# Patient Record
Sex: Male | Born: 2000 | Race: Black or African American | Hispanic: No | Marital: Single | Smoking: Current some day smoker
Health system: Southern US, Community
[De-identification: ages and names within clinical notes are randomized; demographics above are authoritative.]

## PROBLEM LIST (undated history)

## (undated) DIAGNOSIS — B019 Varicella without complication: Secondary | ICD-10-CM

## (undated) DIAGNOSIS — Z8669 Personal history of other diseases of the nervous system and sense organs: Secondary | ICD-10-CM

## (undated) HISTORY — DX: Varicella without complication: B01.9

---
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
## 2021-07-18 ENCOUNTER — Encounter: Payer: Self-pay | Admitting: Emergency Medicine

## 2021-07-18 ENCOUNTER — Emergency Department
Admission: EM | Admit: 2021-07-18 | Discharge: 2021-07-18 | Disposition: A | Discharge status: Home / Self Care | Payer: Medicaid Other | Attending: Emergency Medicine | Admitting: Emergency Medicine

## 2021-07-18 ENCOUNTER — Other Ambulatory Visit: Payer: Self-pay

## 2021-07-18 DIAGNOSIS — H1031 Unspecified acute conjunctivitis, right eye: Secondary | ICD-10-CM | POA: Diagnosis not present

## 2021-07-18 DIAGNOSIS — J019 Acute sinusitis, unspecified: Secondary | ICD-10-CM | POA: Insufficient documentation

## 2021-07-18 DIAGNOSIS — R519 Headache, unspecified: Secondary | ICD-10-CM | POA: Diagnosis present

## 2021-07-18 MED ORDER — AMOXICILLIN-POT CLAVULANATE 875-125 MG PO TABS: 1.0000 | ORAL_TABLET | Freq: Two times a day (BID) | ORAL | 0 refills | Status: AC

## 2021-07-18 MED ORDER — GENTAMICIN SULFATE 0.3 % OP SOLN: 1.0000 [drp] | Freq: Three times a day (TID) | OPHTHALMIC | 0 refills | Status: AC

## 2021-07-18 NOTE — ED Triage Notes (Signed)
Pt reports woke up this am and his right eye was crusted shut. Pt states all day his right eye has been burning, itching and and red.

## 2021-07-18 NOTE — Discharge Instructions (Signed)
Use the eye drops as directed. Take the antibiotic as directed. Follow-up with your provider or a local urgent care for ongoing symptoms.

## 2021-07-18 NOTE — ED Provider Notes (Signed)
[facility] Emergency Department Provider Note     Event Date/Time   First MD Initiated Contact with Patient 07/18/21 1626     (approximate)   History   Conjunctivitis   HPI  Thomas Odom is a 21 y.o. male presents to the ED with right eye crusting and matting upon awakening this morning.  He also reports itching and burning to the eye as well as some redness.  He denies any recent injury, trauma, or fevers.  He also endorses some pain to the right side of his frontal and maxillary sinuses.    Physical Exam   Triage Vital Signs: ED Triage Vitals  Enc Vitals Group     BP 07/18/21 1604 132/63     Pulse Rate 07/18/21 1604 68     Resp 07/18/21 1604 16     Temp 07/18/21 1604 98.7 F (37.1 C)     Temp Source 07/18/21 1604 Oral     SpO2 07/18/21 1604 96 %     Weight 07/18/21 1603 185 lb (83.9 kg)     Height 07/18/21 1603 6\' 2"  (1.88 m)     Head Circumference --      Peak Flow --      Pain Score 07/18/21 1603 8     Pain Loc --      Pain Edu? --      Excl. in GC? --     Most recent vital signs: Vitals:   07/18/21 1604  BP: 132/63  Pulse: 68  Resp: 16  Temp: 98.7 F (37.1 C)  SpO2: 96%    General Awake, no distress.  HEENT NCAT. PERRL. EOMI. conjunctiva injected on the right.  Some remanence of purulent crust noted to the lashes.  Tender to palpation over the frontal and maxillary sinuses.  No rhinorrhea. Mucous membranes are moist.  CV:  Good peripheral perfusion.  RESP:  Normal effort.  ABD:  No distention.     ED Results / Procedures / Treatments   Labs (all labs ordered are listed, but only abnormal results are displayed) Labs Reviewed - No data to display   EKG    RADIOLOGY   No results found.   PROCEDURES:  Critical Care performed: No  Procedures   MEDICATIONS ORDERED IN ED: Medications - No data to display   IMPRESSION / MDM / ASSESSMENT AND PLAN / ED COURSE  I reviewed the triage vital signs and the  nursing notes.                              Differential diagnosis includes, but is not limited to, AOM, sinusitis, viral URI, strep pharyngitis, bacterial conjunctivitis, viral conjunctivitis  Patient's diagnosis is consistent with acute sinusitis and right a bacterial conjunctivitis. Patient will be discharged home with prescriptions for Augmentin and Garamycin solution. Patient is to follow up with his primary provider as needed or otherwise directed. Patient is given ED precautions to return to the ED for any worsening or new symptoms.    FINAL CLINICAL IMPRESSION(S) / ED DIAGNOSES   Final diagnoses:  Acute non-recurrent sinusitis, unspecified location  Acute conjunctivitis of right eye, unspecified acute conjunctivitis type     Rx / DC Orders   ED Discharge Orders          Ordered    gentamicin (GARAMYCIN) 0.3 % ophthalmic solution  3 times daily        07/18/21  1634    amoxicillin-clavulanate (AUGMENTIN) 875-125 MG tablet  2 times daily        07/18/21 1634             Note:  This document was prepared using Dragon voice recognition software and may include unintentional dictation errors.    Lissa Hoard, PA-C 07/18/21 2359    Jene Every, MD 07/19/21 1257

## 2022-02-03 ENCOUNTER — Ambulatory Visit: Payer: Medicaid Other | Admitting: Nurse Practitioner

## 2022-03-27 ENCOUNTER — Ambulatory Visit: Payer: Medicaid Other | Admitting: Nurse Practitioner

## 2022-04-17 ENCOUNTER — Ambulatory Visit: Payer: Self-pay | Admitting: Family

## 2022-07-13 ENCOUNTER — Encounter: Payer: Self-pay | Admitting: Physician Assistant

## 2022-10-04 ENCOUNTER — Ambulatory Visit: Payer: Medicaid Other | Admitting: Nurse Practitioner

## 2022-10-26 ENCOUNTER — Encounter: Payer: Self-pay | Admitting: Emergency Medicine

## 2022-10-26 ENCOUNTER — Other Ambulatory Visit: Payer: Self-pay

## 2022-10-26 ENCOUNTER — Emergency Department
Admission: EM | Admit: 2022-10-26 | Discharge: 2022-10-26 | Disposition: A | Discharge status: Home / Self Care | Payer: Medicaid Other | Attending: Emergency Medicine | Admitting: Emergency Medicine

## 2022-10-26 DIAGNOSIS — L02415 Cutaneous abscess of right lower limb: Secondary | ICD-10-CM | POA: Diagnosis present

## 2022-10-26 DIAGNOSIS — L0291 Cutaneous abscess, unspecified: Secondary | ICD-10-CM

## 2022-10-26 DIAGNOSIS — L0231 Cutaneous abscess of buttock: Secondary | ICD-10-CM | POA: Diagnosis not present

## 2022-10-26 MED ORDER — SULFAMETHOXAZOLE-TRIMETHOPRIM 800-160 MG PO TABS
1.0000 | ORAL_TABLET | Freq: Two times a day (BID) | ORAL | 0 refills | Status: AC
  Filled 2022-10-26: qty 14, 7d supply, fill #0

## 2022-10-26 MED ORDER — LIDOCAINE HCL (PF) 1 % IJ SOLN
5.0000 mL | Freq: Once | INTRAMUSCULAR | Status: AC
  Administered 2022-10-26: 5 mL via INTRADERMAL
  Filled 2022-10-26: qty 5

## 2022-10-26 NOTE — ED Provider Notes (Signed)
[facility] Provider Note    Event Date/Time   First MD Initiated Contact with Patient 10/26/22 1334     (approximate)   History   No chief complaint on file.   HPI  Thomas Odom is a 22 y.o. male who presents for evaluation of a cyst on the back of his right leg.  Patient states it started as a small bump and has increased in size and pain.  He has not noticed any draining from the area.     Physical Exam   Triage Vital Signs: ED Triage Vitals  Encounter Vitals Group     BP 10/26/22 1257 (!) 108/56     Systolic BP Percentile --      Diastolic BP Percentile --      Pulse Rate 10/26/22 1257 76     Resp 10/26/22 1257 16     Temp 10/26/22 1257 98.6 F (37 C)     Temp Source 10/26/22 1257 Oral     SpO2 10/26/22 1257 97 %     Weight 10/26/22 1258 185 lb (83.9 kg)     Height 10/26/22 1258 6\' 3"  (1.905 m)     Head Circumference --      Peak Flow --      Pain Score 10/26/22 1258 9     Pain Loc --      Pain Education --      Exclude from Growth Chart --     Most recent vital signs: Vitals:   10/26/22 1257  BP: (!) 108/56  Pulse: 76  Resp: 16  Temp: 98.6 F (37 C)  SpO2: 97%    General: Awake, no distress.  CV:  Good peripheral perfusion.  Resp:  Normal effort.  Abd:  No distention.  Other:  About 3 cm area of fluctuance in the crease between patient's right buttock on the bottom and thigh, no erythema, no warmth   ED Results / Procedures / Treatments   Labs (all labs ordered are listed, but only abnormal results are displayed) Labs Reviewed - No data to display   PROCEDURES:  Critical Care performed: No  ..Incision and Drainage  Date/Time: 10/26/2022 2:41 PM  Performed by: Cameron Ali, PA-C Authorized by: Cameron Ali, PA-C   Consent:    Consent obtained:  Verbal   Consent given by:  Patient   Risks discussed:  Bleeding, incomplete drainage, pain and infection   Alternatives discussed:  No  treatment Universal protocol:    Patient identity confirmed:  Verbally with patient Location:    Type:  Abscess   Size:  5 cm   Location:  Lower extremity   Lower extremity location:  Buttock   Buttock location:  R buttock Pre-procedure details:    Skin preparation:  Povidone-iodine Sedation:    Sedation type:  None Anesthesia:    Anesthesia method:  Local infiltration   Local anesthetic:  Lidocaine 1% w/o epi Procedure type:    Complexity:  Complex Procedure details:    Ultrasound guidance: no     Incision types:  Stab incision   Incision depth:  Dermal   Wound management:  Probed and deloculated   Drainage:  Bloody and purulent   Drainage amount:  Moderate   Wound treatment:  Wound left open   Packing materials:  1/4 in iodoform gauze   Amount 1/4" iodoform:  15 cm Post-procedure details:    Procedure completion:  Tolerated    MEDICATIONS ORDERED IN ED:  Medications  lidocaine (PF) (XYLOCAINE) 1 % injection 5 mL (5 mLs Intradermal Given by Other 10/26/22 1435)     IMPRESSION / MDM / ASSESSMENT AND PLAN / ED COURSE  I reviewed the triage vital signs and the nursing notes.                             22 year old male presents for evaluation of possible cyst on the back of his right leg.  VSS in triage patient NAD on exam.    Differential diagnosis includes, but is not limited to, abscess, folliculitis, cyst, cellulitis.  Patient's presentation is most consistent with acute, uncomplicated illness.  Bedside ultrasound showed a fluid pocket and presentation is consistent with a abscess.  Patient was agreeable to incision and drainage, please see procedure note for further details.  Patient was instructed on wound care, I advised him to remove the packing after 3 days.  I will also prescribe patient oral antibiotics.  We discussed hygiene and trying a benzyl peroxide body wash.  Patient voiced understanding, all questions were answered and he was stable at discharge.       FINAL CLINICAL IMPRESSION(S) / ED DIAGNOSES   Final diagnoses:  Abscess     Rx / DC Orders   ED Discharge Orders          Ordered    sulfamethoxazole-trimethoprim (BACTRIM DS) 800-160 MG tablet  2 times daily        10/26/22 1448             Note:  This document was prepared using Dragon voice recognition software and may include unintentional dictation errors.   Cameron Ali, PA-C 10/26/22 1449    Minna Antis, MD 10/26/22 1924

## 2022-10-26 NOTE — ED Triage Notes (Signed)
Pt endorses cyst on back of right leg for a few days. Denies drainage. Pt been using hot compress and took one abx pill.

## 2022-10-26 NOTE — Discharge Instructions (Signed)
Please remove the packing after 3 days. You can take tylenol and ibuprofen as needed for pain. Wash the area with soap and water daily. Keep covered with a bandage until healed.   You can try using an exfoliating body wash with benzyl peroxide, salicylic acid or AHA/BHA to help prevent future occurrences.

## 2022-10-30 NOTE — Group Note (Deleted)

## 2022-11-09 ENCOUNTER — Encounter: Payer: Self-pay | Admitting: Nurse Practitioner

## 2022-11-09 ENCOUNTER — Other Ambulatory Visit (HOSPITAL_COMMUNITY)
Admission: RE | Admit: 2022-11-09 | Discharge: 2022-11-09 | Disposition: A | Discharge status: Home / Self Care | Payer: 59 | Source: Ambulatory Visit | Attending: Nurse Practitioner | Admitting: Nurse Practitioner

## 2022-11-09 ENCOUNTER — Ambulatory Visit (INDEPENDENT_AMBULATORY_CARE_PROVIDER_SITE_OTHER): Payer: 59 | Admitting: Nurse Practitioner

## 2022-11-09 VITALS — BP 108/74 | HR 69 | Temp 97.7°F | Ht 75.0 in | Wt 191.8 lb

## 2022-11-09 DIAGNOSIS — Z0000 Encounter for general adult medical examination without abnormal findings: Secondary | ICD-10-CM | POA: Diagnosis not present

## 2022-11-09 DIAGNOSIS — Z113 Encounter for screening for infections with a predominantly sexual mode of transmission: Secondary | ICD-10-CM | POA: Insufficient documentation

## 2022-11-09 DIAGNOSIS — Z13220 Encounter for screening for lipoid disorders: Secondary | ICD-10-CM | POA: Diagnosis not present

## 2022-11-09 DIAGNOSIS — Z1329 Encounter for screening for other suspected endocrine disorder: Secondary | ICD-10-CM

## 2022-11-09 HISTORY — DX: Encounter for general adult medical examination without abnormal findings: Z00.00

## 2022-11-09 LAB — CBC WITH DIFFERENTIAL/PLATELET
Basophils Absolute: 0 10*3/uL (ref 0.0–0.1)
Basophils Relative: 0.6 % (ref 0.0–3.0)
Eosinophils Absolute: 0 10*3/uL (ref 0.0–0.7)
Eosinophils Relative: 0.6 % (ref 0.0–5.0)
HCT: 44.6 % (ref 39.0–52.0)
Hemoglobin: 14.6 g/dL (ref 13.0–17.0)
Lymphocytes Relative: 30.3 % (ref 12.0–46.0)
Lymphs Abs: 1.8 10*3/uL (ref 0.7–4.0)
MCHC: 32.8 g/dL (ref 30.0–36.0)
MCV: 92.4 fl (ref 78.0–100.0)
Monocytes Absolute: 0.3 10*3/uL (ref 0.1–1.0)
Monocytes Relative: 5.6 % (ref 3.0–12.0)
Neutro Abs: 3.8 10*3/uL (ref 1.4–7.7)
Neutrophils Relative %: 62.9 % (ref 43.0–77.0)
Platelets: 248 10*3/uL (ref 150.0–400.0)
RBC: 4.83 Mil/uL (ref 4.22–5.81)
RDW: 14.4 % (ref 11.5–15.5)
WBC: 6 10*3/uL (ref 4.0–10.5)

## 2022-11-09 LAB — COMPREHENSIVE METABOLIC PANEL
ALT: 19 U/L (ref 0–53)
AST: 26 U/L (ref 0–37)
Albumin: 4.5 g/dL (ref 3.5–5.2)
Alkaline Phosphatase: 62 U/L (ref 39–117)
BUN: 16 mg/dL (ref 6–23)
CO2: 30 mEq/L (ref 19–32)
Calcium: 9.5 mg/dL (ref 8.4–10.5)
Chloride: 103 mEq/L (ref 96–112)
Creatinine, Ser: 1.03 mg/dL (ref 0.40–1.50)
GFR: 103.47 mL/min (ref >60.00)
Glucose, Bld: 95 mg/dL (ref 70–99)
Potassium: 4 mEq/L (ref 3.5–5.1)
Sodium: 141 mEq/L (ref 135–145)
Total Bilirubin: 0.5 mg/dL (ref 0.2–1.2)
Total Protein: 7.6 g/dL (ref 6.0–8.3)

## 2022-11-09 LAB — LIPID PANEL
Cholesterol: 172 mg/dL (ref 0–200)
HDL: 55.5 mg/dL (ref >39.00)
LDL Cholesterol: 107 mg/dL — ABNORMAL HIGH (ref 0–99)
NonHDL: 116.97
Total CHOL/HDL Ratio: 3
Triglycerides: 52 mg/dL (ref 0.0–149.0)
VLDL: 10.4 mg/dL (ref 0.0–40.0)

## 2022-11-09 LAB — TSH: TSH: 1.03 u[IU]/mL (ref 0.35–5.50)

## 2022-11-09 NOTE — Assessment & Plan Note (Addendum)
Physical exam complete. Lab work as outlined. Will contact patient with results. Flu vaccine not due. Tetanus vaccine- due, declined today. Encouraged patient to return for this or get at local health department or pharmacy. Declined additional COVID vaccines. HIV and Hep C screenings today. Counseled on tobacco cessation. Counseled on decreasing alcohol intake, concern for binge drinking. Advised to limit alcohol intake to 0-2 drinks per day. Recommended follow up with Dentist and establishing with Ophthalmology for annual exams. Encouraged to continue healthy diet and exercise. Return to care in one year, sooner PRN.

## 2022-11-09 NOTE — Progress Notes (Signed)
Bethanie Dicker, NP-C Phone: [phone]  Thomas Odom is a 22 y.o. male who presents today to establish care and for annual exam. He has no significant past medical history. He is not on any medications. He is requesting STD testing today.   Sexually Transmitted Disease Check: Patient presents for sexually transmitted disease check. Sexual history reviewed with the patient. STD exposure: sexual contact 3 weeks ago with individual thought to have chlamydia and multiple sexual partners over the past several months. Previous history of STD:  none. Current symptoms include none.  Contraception: condoms.  Diet: Cooks at home 50% of the time, likes junk food, eats vegetables Exercise: Gym- weights and cardio- 3 days per week Family history-  Prostate cancer: No  Colon cancer: No Sexually active: Yes Vaccines-   Flu: Not due  Tetanus: 09/13/2012- Due! Declined today  COVID19: x 1 HIV screening: Today Hep C Screening: Today Tobacco use: Yes, 2 black and milds per day Alcohol use: Yes, mostly on weekends- 10+ drinks Illicit Drug use: Yes, marijuana daily Dentist: Yes Ophthalmology: No  Active Ambulatory Problems    Diagnosis Date Noted   Preventative health care 11/09/2022   Screening for STD (sexually transmitted disease) 11/09/2022   Resolved Ambulatory Problems    Diagnosis Date Noted   No Resolved Ambulatory Problems   Past Medical History:  Diagnosis Date   Chicken pox    History of Bell's palsy     Family History  Problem Relation Age of Onset   Hypertension Father     Social History   Socioeconomic History   Marital status: Single    Spouse name: Not on file   Number of children: Not on file   Years of education: Not on file   Highest education level: Not on file  Occupational History   Not on file  Tobacco Use   Smoking status: Some Days    Types: E-cigarettes   Smokeless tobacco: Never  Substance and Sexual Activity   Alcohol use: Yes   Drug use: Yes    Sexual activity: Yes    Birth control/protection: Condom  Other Topics Concern   Not on file  Social History Narrative   Not on file   Social Determinants of Health   Financial Resource Strain: Not on file  Food Insecurity: Not on file  Transportation Needs: Not on file  Physical Activity: Not on file  Stress: Not on file  Social Connections: Not on file  Intimate Partner Violence: Not on file    ROS  General:  Negative for unexplained weight loss, fever Skin: Negative for new or changing mole, sore that won't heal HEENT: Negative for trouble hearing, trouble seeing, ringing in ears, mouth sores, hoarseness, change in voice, dysphagia. CV:  Negative for chest pain, dyspnea, edema, palpitations Resp: Negative for cough, dyspnea, hemoptysis GI: Negative for nausea, vomiting, diarrhea, constipation, abdominal pain, melena, hematochezia. GU: Negative for dysuria, incontinence, urinary hesitance, hematuria, vaginal or penile discharge, polyuria, sexual difficulty, lumps in testicle or breasts MSK: Negative for muscle cramps or aches, joint pain or swelling Neuro: Negative for headaches, weakness, numbness, dizziness, passing out/fainting Psych: Negative for depression, anxiety, memory problems  Objective  Physical Exam Vitals:   11/09/22 0949  BP: 108/74  Pulse: 69  Temp: 97.7 F (36.5 C)  SpO2: 97%    BP Readings from Last 3 Encounters:  11/09/22 108/74  10/26/22 (!) 108/56  07/18/21 132/63   Wt Readings from Last 3 Encounters:  11/09/22 191 lb 12.8  oz (87 kg)  10/26/22 185 lb (83.9 kg)  07/18/21 185 lb (83.9 kg)    Physical Exam Constitutional:      General: He is not in acute distress.    Appearance: Normal appearance.  HENT:     Head: Normocephalic.     Right Ear: Tympanic membrane normal.     Left Ear: Tympanic membrane normal.     Nose: Nose normal.     Mouth/Throat:     Mouth: Mucous membranes are moist.     Pharynx: Oropharynx is clear.  Eyes:      Conjunctiva/sclera: Conjunctivae normal.     Pupils: Pupils are equal, round, and reactive to light.  Neck:     Thyroid: No thyromegaly.  Cardiovascular:     Rate and Rhythm: Normal rate and regular rhythm.     Heart sounds: Normal heart sounds.  Pulmonary:     Effort: Pulmonary effort is normal.     Breath sounds: Normal breath sounds.  Abdominal:     General: Abdomen is flat. Bowel sounds are normal.     Palpations: Abdomen is soft. There is no mass.     Tenderness: There is no abdominal tenderness.  Musculoskeletal:        General: Normal range of motion.  Lymphadenopathy:     Cervical: No cervical adenopathy.  Skin:    General: Skin is warm and dry.     Findings: No rash.  Neurological:     General: No focal deficit present.     Mental Status: He is alert.  Psychiatric:        Mood and Affect: Mood normal.        Behavior: Behavior normal.    Assessment/Plan:   Preventative health care Assessment & Plan: Physical exam complete. Lab work as outlined. Will contact patient with results. Flu vaccine not due. Tetanus vaccine- due, declined today. Encouraged patient to return for this or get at local health department or pharmacy. Declined additional COVID vaccines. HIV and Hep C screenings today. Counseled on tobacco cessation. Counseled on decreasing alcohol intake, concern for binge drinking. Advised to limit alcohol intake to 0-2 drinks per day. Recommended follow up with Dentist and establishing with Ophthalmology for annual exams. Encouraged to continue healthy diet and exercise. Return to care in one year, sooner PRN.   Orders: -     CBC with Differential/Platelet -     Comprehensive metabolic panel  Screening for STD (sexually transmitted disease) Assessment & Plan: Urine and lab work as outlined. Will contact patient with results. He declined treatment today, he would like to await his results. Denies symptoms. Counseled on safe sex practices and wearing a condom.  Counseled on high risk behaviors.   Orders: -     Urine cytology ancillary only -     HepB+HepC+HIV Panel -     HSV(herpes simplex vrs) 1+2 ab-IgG -     RPR  Thyroid disorder screen -     TSH  Lipid screening -     Lipid panel   Return in about 1 year (around 11/09/2023) for Annual Exam, sooner PRN.   Bethanie Dicker, NP-C [facility] - [employer]

## 2022-11-09 NOTE — Assessment & Plan Note (Signed)
Urine and lab work as outlined. Will contact patient with results. He declined treatment today, he would like to await his results. Denies symptoms. Counseled on safe sex practices and wearing a condom. Counseled on high risk behaviors.

## 2022-11-10 LAB — HEPB+HEPC+HIV PANEL
HIV Screen 4th Generation wRfx: NONREACTIVE
Hep B C IgM: NEGATIVE
Hep B Core Total Ab: NEGATIVE
Hep B E Ab: NONREACTIVE
Hep B E Ag: NEGATIVE
Hep B Surface Ab, Qual: NONREACTIVE
Hep C Virus Ab: NONREACTIVE
Hepatitis B Surface Ag: NEGATIVE

## 2022-11-10 LAB — URINE CYTOLOGY ANCILLARY ONLY
Chlamydia: NEGATIVE
Comment: NEGATIVE
Comment: NEGATIVE
Comment: NORMAL
Neisseria Gonorrhea: NEGATIVE
Trichomonas: NEGATIVE

## 2022-11-10 LAB — HSV(HERPES SIMPLEX VRS) I + II AB-IGG
HAV 1 IGG,TYPE SPECIFIC AB: <0.9 index
HSV 2 IGG,TYPE SPECIFIC AB: <0.9 index

## 2022-11-10 LAB — RPR: RPR Ser Ql: NONREACTIVE

## 2023-02-23 ENCOUNTER — Other Ambulatory Visit: Payer: Self-pay

## 2023-02-23 ENCOUNTER — Emergency Department (HOSPITAL_COMMUNITY): Payer: 59

## 2023-02-23 ENCOUNTER — Emergency Department (HOSPITAL_COMMUNITY)
Admission: EM | Admit: 2023-02-23 | Discharge: 2023-02-24 | Disposition: A | Discharge status: Home / Self Care | Payer: 59 | Attending: Emergency Medicine | Admitting: Emergency Medicine

## 2023-02-23 ENCOUNTER — Encounter (HOSPITAL_COMMUNITY): Payer: Self-pay | Admitting: Emergency Medicine

## 2023-02-23 DIAGNOSIS — S4992XA Unspecified injury of left shoulder and upper arm, initial encounter: Secondary | ICD-10-CM | POA: Diagnosis not present

## 2023-02-23 DIAGNOSIS — M25512 Pain in left shoulder: Secondary | ICD-10-CM | POA: Diagnosis not present

## 2023-02-23 DIAGNOSIS — S0990XA Unspecified injury of head, initial encounter: Secondary | ICD-10-CM | POA: Diagnosis not present

## 2023-02-23 DIAGNOSIS — M79651 Pain in right thigh: Secondary | ICD-10-CM | POA: Diagnosis not present

## 2023-02-23 DIAGNOSIS — M25561 Pain in right knee: Secondary | ICD-10-CM | POA: Insufficient documentation

## 2023-02-23 DIAGNOSIS — R0789 Other chest pain: Secondary | ICD-10-CM | POA: Diagnosis not present

## 2023-02-23 DIAGNOSIS — S199XXA Unspecified injury of neck, initial encounter: Secondary | ICD-10-CM | POA: Diagnosis not present

## 2023-02-23 DIAGNOSIS — M79622 Pain in left upper arm: Secondary | ICD-10-CM | POA: Diagnosis not present

## 2023-02-23 DIAGNOSIS — Y92410 Unspecified street and highway as the place of occurrence of the external cause: Secondary | ICD-10-CM | POA: Diagnosis not present

## 2023-02-23 DIAGNOSIS — M25551 Pain in right hip: Secondary | ICD-10-CM | POA: Diagnosis not present

## 2023-02-23 DIAGNOSIS — S0081XA Abrasion of other part of head, initial encounter: Secondary | ICD-10-CM | POA: Insufficient documentation

## 2023-02-23 DIAGNOSIS — M79661 Pain in right lower leg: Secondary | ICD-10-CM | POA: Diagnosis not present

## 2023-02-23 DIAGNOSIS — Z041 Encounter for examination and observation following transport accident: Secondary | ICD-10-CM | POA: Diagnosis not present

## 2023-02-23 DIAGNOSIS — D72829 Elevated white blood cell count, unspecified: Secondary | ICD-10-CM | POA: Diagnosis not present

## 2023-02-23 DIAGNOSIS — I10 Essential (primary) hypertension: Secondary | ICD-10-CM | POA: Diagnosis not present

## 2023-02-23 DIAGNOSIS — R519 Headache, unspecified: Secondary | ICD-10-CM | POA: Diagnosis not present

## 2023-02-23 DIAGNOSIS — R079 Chest pain, unspecified: Secondary | ICD-10-CM | POA: Diagnosis not present

## 2023-02-23 LAB — I-STAT CG4 LACTIC ACID, ED: Lactic Acid, Venous: 1 mmol/L (ref 0.5–1.9)

## 2023-02-23 LAB — I-STAT CHEM 8, ED
BUN: 15 mg/dL (ref 6–20)
Calcium, Ion: 1.16 mmol/L (ref 1.15–1.40)
Chloride: 104 mmol/L (ref 98–111)
Creatinine, Ser: 1 mg/dL (ref 0.61–1.24)
Glucose, Bld: 79 mg/dL (ref 70–99)
HCT: 43 % (ref 39.0–52.0)
Hemoglobin: 14.6 g/dL (ref 13.0–17.0)
Potassium: 3.7 mmol/L (ref 3.5–5.1)
Sodium: 141 mmol/L (ref 135–145)
TCO2: 27 mmol/L (ref 22–32)

## 2023-02-23 MED ORDER — KETOROLAC TROMETHAMINE 30 MG/ML IJ SOLN
30.0000 mg | Freq: Once | INTRAMUSCULAR | Status: AC
  Administered 2023-02-23: 30 mg via INTRAVENOUS
  Filled 2023-02-23: qty 1

## 2023-02-23 MED ORDER — ACETAMINOPHEN 500 MG PO TABS
1000.0000 mg | ORAL_TABLET | Freq: Once | ORAL | Status: AC
  Administered 2023-02-23: 1000 mg via ORAL
  Filled 2023-02-23: qty 2
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
## 2023-02-24 DIAGNOSIS — S0081XA Abrasion of other part of head, initial encounter: Secondary | ICD-10-CM | POA: Diagnosis not present

## 2023-02-24 LAB — CBC
HCT: 42.2 % (ref 39.0–52.0)
Hemoglobin: 14.3 g/dL (ref 13.0–17.0)
MCH: 30.8 pg (ref 26.0–34.0)
MCHC: 33.9 g/dL (ref 30.0–36.0)
MCV: 90.8 fL (ref 80.0–100.0)
Platelets: 204 10*3/uL (ref 150–400)
RBC: 4.65 MIL/uL (ref 4.22–5.81)
RDW: 13.5 % (ref 11.5–15.5)
WBC: 15.1 10*3/uL — ABNORMAL HIGH (ref 4.0–10.5)
nRBC: 0 % (ref 0.0–0.2)

## 2023-02-24 MED ORDER — LIDOCAINE 5 % EX PTCH
1.0000 | MEDICATED_PATCH | CUTANEOUS | Status: DC
  Administered 2023-02-24: 1 via TRANSDERMAL
  Filled 2023-02-24: qty 1

## 2023-02-24 MED ORDER — METHOCARBAMOL 500 MG PO TABS: 500.0000 mg | ORAL_TABLET | Freq: Three times a day (TID) | ORAL | 0 refills | Status: DC | PRN

## 2023-02-24 NOTE — ED Notes (Signed)
 Ccollar removed by [employer] PA
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
## 2023-08-06 ENCOUNTER — Ambulatory Visit: Admitting: Nurse Practitioner

## 2023-08-06 VITALS — BP 110/60 | HR 63 | Temp 98.5°F | Ht 75.0 in | Wt 201.0 lb

## 2023-08-06 DIAGNOSIS — L84 Corns and callosities: Secondary | ICD-10-CM

## 2023-08-06 DIAGNOSIS — M722 Plantar fascial fibromatosis: Secondary | ICD-10-CM | POA: Diagnosis not present

## 2023-08-06 NOTE — Progress Notes (Signed)
 Leron Glance, NP-C Phone: [phone]  Thomas Odom is a 23 y.o. male who presents today for pain with walking.   Discussed the use of AI scribe software for clinical note transcription with the patient, who gave verbal consent to proceed.  History of Present Illness   Thomas Odom is a 23 year old male who presents with severe foot pain and calluses.  He experiences severe foot pain, which he attributes to his new job that requires standing for approximately ten hours a day. The pain is described as both muscular and initially thought to be due to a cut on the bottom of his foot. The callus causes a stinging sensation and feels like it is 'on fire' when he removes his shoes.  He wears steel toe shoes with inserts and notes that his heels also hurt, with the pain extending up his foot. He feels like he has 'turf toe' in his big toe, describing it as inflamed but not broken. To manage the pain, he takes two to three ibuprofen daily, which provides some relief.  He tends to walk on his toes, which may contribute to the pressure and formation of calluses on the outer edge of his feet. No leg pain is reported, indicating the issue is localized to his feet.      Social History   Tobacco Use  Smoking Status Some Days   Types: E-cigarettes  Smokeless Tobacco Never    No current outpatient medications on file prior to visit.   No current facility-administered medications on file prior to visit.     ROS see history of present illness  Objective  Physical Exam Vitals:   08/06/23 1337  BP: 110/60  Pulse: 63  Temp: 98.5 F (36.9 C)  SpO2: 97%    BP Readings from Last 3 Encounters:  08/06/23 110/60  02/24/23 (!) 157/70  11/09/22 108/74   Wt Readings from Last 3 Encounters:  08/06/23 201 lb (91.2 kg)  02/23/23 192 lb (87.1 kg)  11/09/22 191 lb 12.8 oz (87 kg)    Physical Exam Constitutional:      General: He is not in acute distress.    Appearance: Normal  appearance.  HENT:     Head: Normocephalic.   Cardiovascular:     Rate and Rhythm: Normal rate and regular rhythm.     Heart sounds: Normal heart sounds.  Pulmonary:     Effort: Pulmonary effort is normal.     Breath sounds: Normal breath sounds.   Musculoskeletal:     Right foot: Normal range of motion.     Left foot: Normal range of motion.       Feet:  Feet:     Left foot:     Skin integrity: Callus present.   Skin:    General: Skin is warm and dry.   Neurological:     General: No focal deficit present.     Mental Status: He is alert.   Psychiatric:        Mood and Affect: Mood normal.        Behavior: Behavior normal.      Assessment/Plan: Please see individual problem list.  Callus of foot Assessment & Plan: He experiences severe foot pain from prolonged standing and callus formation, possibly due to improper foot mechanics. Hallux pain is likely from inflammation. Refer to podiatry for callus management and evaluation. Advise using a frozen water bottle or tennis ball for foot exercises. Continue ibuprofen up to four times daily.  Ensure use of supportive shoe inserts and supportive shoes outside work.  Orders: -     Ambulatory referral to Podiatry  Plantar fasciitis Assessment & Plan: Heel pain results from plantar fasciitis due to prolonged standing and inadequate shoe support. Provide printed exercises for plantar fasciitis. Recommend using a frozen water bottle or tennis ball for exercises. Continue ibuprofen for pain management. Refer to podiatry for further evaluation and management.   Orders: -     Ambulatory referral to Podiatry      Return if symptoms worsen or fail to improve.   Leron Glance, NP-C [facility] - [facility]

## 2023-08-13 ENCOUNTER — Encounter: Payer: Self-pay | Admitting: Nurse Practitioner

## 2023-08-13 NOTE — Assessment & Plan Note (Signed)
 Heel pain results from plantar fasciitis due to prolonged standing and inadequate shoe support. Provide printed exercises for plantar fasciitis. Recommend using a frozen water bottle or tennis ball for exercises. Continue ibuprofen for pain management. Refer to podiatry for further evaluation and management.

## 2023-08-13 NOTE — Assessment & Plan Note (Signed)
 He experiences severe foot pain from prolonged standing and callus formation, possibly due to improper foot mechanics. Hallux pain is likely from inflammation. Refer to podiatry for callus management and evaluation. Advise using a frozen water bottle or tennis ball for foot exercises. Continue ibuprofen up to four times daily. Ensure use of supportive shoe inserts and supportive shoes outside work.

## 2023-11-09 ENCOUNTER — Encounter: Payer: 59 | Admitting: Nurse Practitioner
